# Patient Record
Sex: Female | Born: 1995 | Race: Black or African American | Hispanic: No | Marital: Single | Smoking: Current every day smoker
Health system: Southern US, Community
[De-identification: ages and names within clinical notes are randomized; demographics above are authoritative.]

## PROBLEM LIST (undated history)

## (undated) DIAGNOSIS — R51 Headache: Secondary | ICD-10-CM

## (undated) DIAGNOSIS — R519 Headache, unspecified: Secondary | ICD-10-CM

## (undated) DIAGNOSIS — H539 Unspecified visual disturbance: Secondary | ICD-10-CM

## (undated) DIAGNOSIS — M419 Scoliosis, unspecified: Secondary | ICD-10-CM

## (undated) HISTORY — DX: Headache, unspecified: R51.9

## (undated) HISTORY — PX: CYST REMOVAL NECK: SHX6281

## (undated) HISTORY — DX: Headache: R51

## (undated) HISTORY — DX: Unspecified visual disturbance: H53.9

---
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
## 2016-05-20 ENCOUNTER — Encounter (HOSPITAL_COMMUNITY): Payer: Self-pay

## 2016-05-20 ENCOUNTER — Emergency Department (HOSPITAL_COMMUNITY)
Admission: EM | Admit: 2016-05-20 | Discharge: 2016-05-20 | Disposition: A | Discharge status: Home / Self Care | Payer: Medicaid Other | Attending: Emergency Medicine | Admitting: Emergency Medicine

## 2016-05-20 DIAGNOSIS — Y999 Unspecified external cause status: Secondary | ICD-10-CM | POA: Insufficient documentation

## 2016-05-20 DIAGNOSIS — Y929 Unspecified place or not applicable: Secondary | ICD-10-CM | POA: Insufficient documentation

## 2016-05-20 DIAGNOSIS — Y939 Activity, unspecified: Secondary | ICD-10-CM | POA: Insufficient documentation

## 2016-05-20 DIAGNOSIS — Z79899 Other long term (current) drug therapy: Secondary | ICD-10-CM | POA: Insufficient documentation

## 2016-05-20 DIAGNOSIS — S0500XA Injury of conjunctiva and corneal abrasion without foreign body, unspecified eye, initial encounter: Secondary | ICD-10-CM | POA: Insufficient documentation

## 2016-05-20 DIAGNOSIS — X58XXXA Exposure to other specified factors, initial encounter: Secondary | ICD-10-CM | POA: Insufficient documentation

## 2016-05-20 HISTORY — DX: Scoliosis, unspecified: M41.9

## 2016-05-20 MED ORDER — FLUORESCEIN SODIUM 1 MG OP STRP
1.0000 | ORAL_STRIP | Freq: Once | OPHTHALMIC | Status: AC
  Administered 2016-05-20: 1 via OPHTHALMIC
  Filled 2016-05-20: qty 1

## 2016-05-20 MED ORDER — KETOROLAC TROMETHAMINE 0.5 % OP SOLN: 1.0000 [drp] | Freq: Three times a day (TID) | OPHTHALMIC | 0 refills | Status: AC | PRN

## 2016-05-20 MED ORDER — ERYTHROMYCIN 5 MG/GM OP OINT: TOPICAL_OINTMENT | OPHTHALMIC | 0 refills | Status: AC

## 2016-05-20 MED ORDER — TETRACAINE HCL 0.5 % OP SOLN
2.0000 [drp] | Freq: Once | OPHTHALMIC | Status: AC
  Administered 2016-05-20: 2 [drp] via OPHTHALMIC
  Filled 2016-05-20: qty 2

## 2016-05-20 MED ORDER — ERYTHROMYCIN 5 MG/GM OP OINT
1.0000 "application " | TOPICAL_OINTMENT | Freq: Once | OPHTHALMIC | Status: AC
  Administered 2016-05-20: 1 via OPHTHALMIC
  Filled 2016-05-20: qty 3.5

## 2016-05-20 NOTE — ED Triage Notes (Signed)
Pt presents with 1 week h/o generalized body pain.  Pt denies any injury, unsure of origin of pain.  Pt reports awakening with bilateral eye pain, reports eyelids were dried together from drainage.  Pt does not wear contacts.  Pt reports no bowel movement x 4 days ago, reports "a little" abdominal pain, reports chest pain, reports sneezing.

## 2016-05-20 NOTE — ED Notes (Signed)
MD at bedside. 

## 2016-05-20 NOTE — ED Notes (Signed)
Pt c/o generalized body aches and aching eyes since yesterday.

## 2016-05-20 NOTE — Discharge Instructions (Signed)
Not sure why uoi have corneal abrasions - but that's the reason for your pain. Apply antibiotics and use the topical pain meds. Call the eye specialist today and set up an appt for coming Monday or Tuesday. Return to the ER if the symptoms get worse.

## 2016-05-20 NOTE — ED Provider Notes (Signed)
[facility]-EMERGENCY DEPT Provider Note   CSN: [id number] Arrival date & time: 05/20/16  1029     History   Chief Complaint No chief complaint on file.   HPI Katrina Mcguire is a 20 y.o. female.  HPI  Pt comes in with cc of bilateral eye pain. Pt states that she went to bed fine, woke up with pain in her eye, which is worse when light gets in her. Pain is bilateral. She denies any known chemical exposure. Does report that she was rubbing her eyes prior to going to bed as she was sleepy. She denies watching TV late in dark room last night. She denies any medical problems or hx of eye problems. Her vision is blurry, but she is unable to fully open her eye.   Past Medical History:  Diagnosis Date  . Scoliosis     There are no active problems to display for this patient.   Past Surgical History:  Procedure Laterality Date  . CYST REMOVAL NECK      OB History    No data available       Home Medications    Prior to Admission medications   Medication Sig Start Date End Date Taking? Authorizing Provider  diphenhydrAMINE (BENADRYL) 25 MG tablet Take 25 mg by mouth every 6 (six) hours as needed for itching.   Yes Historical Provider, MD  erythromycin ophthalmic ointment Place a 1/2 inch ribbon of ointment into the lower eyelid. Apply 6 times a day. 05/20/16   Derwood Kaplan, MD  ketorolac (ACULAR) 0.5 % ophthalmic solution Place 1 drop into both eyes 3 (three) times daily as needed. 05/20/16   Derwood Kaplan, MD  medroxyPROGESTERone (DEPO-PROVERA) 150 MG/ML injection INJECT 150 MG IM EVERY 3 MONTHS 06/10/15   Historical Provider, MD  meloxicam (MOBIC) 7.5 MG tablet Take 1 tablet (7.5 mg total) by mouth 2 (two) times daily after a meal. Patient not taking: Reported on 05/20/2016 09/17/15   Linna Hoff, MD  naproxen (NAPROSYN) 500 MG tablet Take 1 tablet (500 mg total) by mouth 2 (two) times daily with a meal. Patient not taking: Reported on 05/20/2016 06/26/15   Janne Napoleon, NP    traMADol (ULTRAM) 50 MG tablet Take 1 tablet (50 mg total) by mouth every 6 (six) hours as needed. Patient not taking: Reported on 05/20/2016 07/17/15   Arthor Captain, PA-C    Family History History reviewed. No pertinent family history.  Social History Social History  Substance Use Topics  . Smoking status: Never Smoker  . Smokeless tobacco: Never Used  . Alcohol use No     Allergies   Review of patient's allergies indicates no known allergies.   Review of Systems Review of Systems  ROS 10 Systems reviewed and are negative for acute change except as noted in the HPI.     Physical Exam Updated Vital Signs BP 121/89 (BP Location: Right Arm)   Pulse 93   Temp 98.4 F (36.9 C) (Oral)   Resp 18   Ht 5\' 4"  (1.626 m)   Wt 190 lb (86.2 kg)   SpO2 99%   BMI 32.61 kg/m   Physical Exam  Constitutional: She is oriented to person, place, and time. She appears well-developed.  HENT:  Head: Normocephalic and atraumatic.  Eyes: EOM are normal.  Bilateral Eye exam: EOMI, PERRL No photophobia  Eye lid eversion reveals no foreign body Pt has no chemosis Fluorecin test under woods lamp reveals that on the L eye,  around 5 o clock she has dye uptake and on the R eye she has dye uptake at 6 o clock of the cornea. tonopen reveals pressures of 16, and 19 on the L eye, R eye wasn't done due to tonopen stopped working and suspicion after normal pressure on the L eye for elevated R eye pressure was low.     Visual Acuity  Right Eye Distance:   Left Eye Distance:   Bilateral Distance:    Right Eye Near: R Near: 20/30 Left Eye Near:  L Near: 20/40 Bilateral Near:  20/30   Neck: Normal range of motion. Neck supple.  Cardiovascular: Normal rate.   Pulmonary/Chest: Effort normal.  Abdominal: Bowel sounds are normal.  Neurological: She is alert and oriented to person, place, and time.  Skin: Skin is warm and dry.  Nursing note and vitals reviewed.    ED Treatments /  Results  Labs (all labs ordered are listed, but only abnormal results are displayed) Labs Reviewed - No data to display  EKG  EKG Interpretation None       Radiology No results found.  Procedures Procedures (including critical care time)  Medications Ordered in ED Medications  erythromycin ophthalmic ointment 1 application (not administered)  fluorescein ophthalmic strip 1 strip (1 strip Both Eyes Given 05/20/16 1149)  tetracaine (PONTOCAINE) 0.5 % ophthalmic solution 2 drop (2 drops Both Eyes Given 05/20/16 1149)     Initial Impression / Assessment and Plan / ED Course  I have reviewed the triage vital signs and the nursing notes.  Pertinent labs & imaging results that were available during my care of the patient were reviewed by me and considered in my medical decision making (see chart for details).  Clinical Course    Pt with eye pain, bilateral, with vision complains. Eye exam reveals corneal abrasion. Slight vision disturbance. She has photophobia. No contacts used. Will start erythromycin and give toradol drops. Pt asked to see eye specialist.  Final Clinical Impressions(s) / ED Diagnoses   Final diagnoses:  Corneal abrasion, unspecified laterality, initial encounter    New Prescriptions New Prescriptions   ERYTHROMYCIN OPHTHALMIC OINTMENT    Place a 1/2 inch ribbon of ointment into the lower eyelid. Apply 6 times a day.   KETOROLAC (ACULAR) 0.5 % OPHTHALMIC SOLUTION    Place 1 drop into both eyes 3 (three) times daily as needed.     Derwood KaplanAnkit Khalif Stender, MD 05/20/16 [phone]

## 2016-05-20 NOTE — ED Notes (Signed)
Pt states she understands instructions. Home stable with steady gait. 

## 2016-12-22 ENCOUNTER — Emergency Department (HOSPITAL_COMMUNITY): Payer: Medicaid Other

## 2016-12-22 ENCOUNTER — Emergency Department (HOSPITAL_COMMUNITY)
Admission: EM | Admit: 2016-12-22 | Discharge: 2016-12-22 | Discharge status: Against Medical Advice | Payer: Medicaid Other | Attending: Emergency Medicine | Admitting: Emergency Medicine

## 2016-12-22 ENCOUNTER — Encounter (HOSPITAL_COMMUNITY): Payer: Self-pay

## 2016-12-22 DIAGNOSIS — Z5321 Procedure and treatment not carried out due to patient leaving prior to being seen by health care provider: Secondary | ICD-10-CM | POA: Insufficient documentation

## 2016-12-22 DIAGNOSIS — R079 Chest pain, unspecified: Secondary | ICD-10-CM | POA: Insufficient documentation

## 2016-12-22 LAB — CBC
HEMATOCRIT: 41.7 % (ref 36.0–46.0)
Hemoglobin: 13.3 g/dL (ref 12.0–15.0)
MCH: 25.3 pg — ABNORMAL LOW (ref 26.0–34.0)
MCHC: 31.9 g/dL (ref 30.0–36.0)
MCV: 79.4 fL (ref 78.0–100.0)
Platelets: 229 10*3/uL (ref 150–400)
RBC: 5.25 MIL/uL — ABNORMAL HIGH (ref 3.87–5.11)
RDW: 14.9 % (ref 11.5–15.5)
WBC: 10.4 10*3/uL (ref 4.0–10.5)

## 2016-12-22 LAB — BASIC METABOLIC PANEL
Anion gap: 11 (ref 5–15)
BUN: 9 mg/dL (ref 6–20)
CO2: 23 mmol/L (ref 22–32)
Calcium: 9.1 mg/dL (ref 8.9–10.3)
Chloride: 102 mmol/L (ref 101–111)
Creatinine, Ser: 0.62 mg/dL (ref 0.44–1.00)
GFR calc non Af Amer: >60 mL/min (ref >60)
Glucose, Bld: 89 mg/dL (ref 65–99)
Potassium: 3.6 mmol/L (ref 3.5–5.1)
SODIUM: 136 mmol/L (ref 135–145)

## 2016-12-22 LAB — I-STAT TROPONIN, ED: Troponin i, poc: 0 ng/mL (ref 0.00–0.08)

## 2016-12-22 NOTE — ED Triage Notes (Signed)
Pt states that around 11pm, central CP started, denies SOB, n/v, states she felt dizzy when CP started.

## 2016-12-22 NOTE — ED Notes (Signed)
Pt called from waiting room with no answer 

## 2017-05-01 ENCOUNTER — Ambulatory Visit (INDEPENDENT_AMBULATORY_CARE_PROVIDER_SITE_OTHER): Payer: Medicaid Other | Admitting: Neurology

## 2017-05-01 ENCOUNTER — Encounter: Payer: Self-pay | Admitting: Neurology

## 2017-05-01 DIAGNOSIS — G43709 Chronic migraine without aura, not intractable, without status migrainosus: Secondary | ICD-10-CM | POA: Diagnosis not present

## 2017-05-01 DIAGNOSIS — IMO0002 Reserved for concepts with insufficient information to code with codable children: Secondary | ICD-10-CM | POA: Insufficient documentation

## 2017-05-01 MED ORDER — RIZATRIPTAN BENZOATE 10 MG PO TBDP: 10.0000 mg | ORAL_TABLET | ORAL | 6 refills | Status: DC | PRN

## 2017-05-01 MED ORDER — ONDANSETRON 4 MG PO TBDP: 4.0000 mg | ORAL_TABLET | Freq: Three times a day (TID) | ORAL | 6 refills | Status: AC | PRN

## 2017-05-01 MED ORDER — VENLAFAXINE HCL ER 37.5 MG PO CP24: 37.5000 mg | ORAL_CAPSULE | Freq: Every day | ORAL | 11 refills | Status: AC

## 2017-05-01 MED ORDER — VENLAFAXINE HCL ER 37.5 MG PO CP24: 37.5000 mg | ORAL_CAPSULE | Freq: Every day | ORAL | 11 refills | Status: DC

## 2017-05-01 MED ORDER — RIZATRIPTAN BENZOATE 10 MG PO TBDP: 10.0000 mg | ORAL_TABLET | ORAL | 6 refills | Status: AC | PRN

## 2017-05-01 MED ORDER — ONDANSETRON 4 MG PO TBDP: 4.0000 mg | ORAL_TABLET | Freq: Three times a day (TID) | ORAL | 6 refills | Status: DC | PRN

## 2017-05-01 NOTE — Progress Notes (Signed)
PATIENT: Katrina Mcguire DOB: 01-04-1996  Chief Complaint  Patient presents with  . Headache    Room 4.  Headaches onset 1 yr. ago. Sts. 2-3 h/a days per week, most often while she is at work (works as an International aid/development worker at [employer]). Not much relief with Ibuprofen or Tylenol. Has worn glasses since she was 6; lenses were updated in July 2017. Sts. she saw her eye doctor last week and rx. has not changed.  Visual acuity today 20/40 bilat with glasses on/fim     HISTORICAL  Katrina Mcguire is a 21 years old female,  seen in refer by optometrist Dr. Conley Rolls, [place],for evaluation of frequent headaches, initial evaluation was May 01 2017.  She reported a history of occasional headache around her menstruation in the past, complains of increased headache since 2017 since her birth control placement Nexplanon, she complains of frequent left frontotemporal region pounding headache with associated light noise sensitivity, lasting for 2-3 hours, with associated nausea, she has 2-3 headaches each week, she has tried over-the-counter ibuprofen Tylenol without helping.  Also complains of depression anxiety, taking trazodone 150 mg every night for chronic insomnia  Trigger for her headaches are bright light, strong smells, loud noise,  REVIEW OF SYSTEMS: Full 14 system review of systems performed and notable only for blurred vision, joint pain, constipation, urination problems, weakness, insomnia, dizziness  ALLERGIES: No Known Allergies  HOME MEDICATIONS: Current Outpatient Prescriptions  Medication Sig Dispense Refill  . erythromycin ophthalmic ointment Place a 1/2 inch ribbon of ointment into the lower eyelid. Apply 6 times a day. 3.5 g 0  . etonogestrel (NEXPLANON) 68 MG IMPL implant 1 each by Subdermal route once.    . traZODone (DESYREL) 150 MG tablet Take 150 mg by mouth at bedtime.    . diphenhydrAMINE (BENADRYL) 25 MG tablet Take 25 mg by mouth every 6 (six) hours as needed  for itching.    Marland Kitchen ketorolac (ACULAR) 0.5 % ophthalmic solution Place 1 drop into both eyes 3 (three) times daily as needed. (Patient not taking: Reported on 05/01/2017) 5 mL 0  . medroxyPROGESTERone (DEPO-PROVERA) 150 MG/ML injection INJECT 150 MG IM EVERY 3 MONTHS  0  . meloxicam (MOBIC) 7.5 MG tablet Take 1 tablet (7.5 mg total) by mouth 2 (two) times daily after a meal. (Patient not taking: Reported on 05/20/2016) 30 tablet 1  . naproxen (NAPROSYN) 500 MG tablet Take 1 tablet (500 mg total) by mouth 2 (two) times daily with a meal. (Patient not taking: Reported on 05/20/2016) 20 tablet 5  . traMADol (ULTRAM) 50 MG tablet Take 1 tablet (50 mg total) by mouth every 6 (six) hours as needed. (Patient not taking: Reported on 05/20/2016) 6 tablet 0   No current facility-administered medications for this visit.     PAST MEDICAL HISTORY: Past Medical History:  Diagnosis Date  . Headache   . Scoliosis   . Vision abnormalities     PAST SURGICAL HISTORY: Past Surgical History:  Procedure Laterality Date  . CYST REMOVAL NECK      FAMILY HISTORY: Family History  Problem Relation Age of Onset  . Hypertension Mother   . Asthma Sister     SOCIAL HISTORY:  Social History   Social History  . Marital status: Single    Spouse name: N/A  . Number of children: N/A  . Years of education: N/A   Occupational History  . Not on file.   Social History Main Topics  .  Smoking status: Current Every Day Smoker    Types: E-cigarettes, Cigarettes  . Smokeless tobacco: Never Used  . Alcohol use No  . Drug use: No  . Sexual activity: Not on file   Other Topics Concern  . Not on file   Social History Narrative  . No narrative on file     PHYSICAL EXAM   Vitals:   05/01/17 1039  BP: 126/74  Pulse: 90  Resp: 14  Weight: 175 lb 8 oz (79.6 kg)  Height: 5\' 4"  (1.626 m)    Not recorded      Body mass index is 30.12 kg/m.  PHYSICAL EXAMNIATION:  Gen: NAD, conversant, well nourised,  obese, well groomed                     Cardiovascular: Regular rate rhythm, no peripheral edema, warm, nontender. Eyes: Conjunctivae clear without exudates or hemorrhage Neck: Supple, no carotid bruits. Pulmonary: Clear to auscultation bilaterally   NEUROLOGICAL EXAM:  MENTAL STATUS: Speech:    Speech is normal; fluent and spontaneous with normal comprehension.  Cognition:     Orientation to time, place and person     Normal recent and remote memory     Normal Attention span and concentration     Normal Language, naming, repeating,spontaneous speech     Fund of knowledge   CRANIAL NERVES: CN II: Visual fields are full to confrontation. Fundoscopic exam is normal with sharp discs and no vascular changes. Pupils are round equal and briskly reactive to light. CN III, IV, VI: extraocular movement are normal. No ptosis. CN V: Facial sensation is intact to pinprick in all 3 divisions bilaterally. Corneal responses are intact.  CN VII: Face is symmetric with normal eye closure and smile. CN VIII: Hearing is normal to rubbing fingers CN IX, X: Palate elevates symmetrically. Phonation is normal. CN XI: Head turning and shoulder shrug are intact CN XII: Tongue is midline with normal movements and no atrophy.  MOTOR: There is no pronator drift of out-stretched arms. Muscle bulk and tone are normal. Muscle strength is normal.  REFLEXES: Reflexes are 2+ and symmetric at the biceps, triceps, knees, and ankles. Plantar responses are flexor.  SENSORY: Intact to light touch, pinprick, positional sensation and vibratory sensation are intact in fingers and toes.  COORDINATION: Rapid alternating movements and fine finger movements are intact. There is no dysmetria on finger-to-nose and heel-knee-shin.    GAIT/STANCE: Posture is normal. Gait is steady with normal steps, base, arm swing, and turning. Heel and toe walking are normal. Tandem gait is normal.  Romberg is absent.   DIAGNOSTIC DATA  (LABS, IMAGING, TESTING) - I reviewed patient records, labs, notes, testing and imaging myself where available.   ASSESSMENT AND PLAN  Katrina Mcguire is a 21 y.o. female   Chronic migraine headaches  Start preventive medications,magnesium oxide 400 mg twice a day, riboflavin 100 mg twice a day,  Effexor ER 37.5 mg daily  Maxalt, and Zofran as needed Chronic depression anxiety  She is on trazodone 150 mg every night   Levert FeinsteinYijun Jema Deegan, M.D. Ph.D.  [facility] [address], [address] [place], [place] [address] Ph: [phone] Fax: [phone]  LK:[id number]:Kramer, StonewallMinda, NP, Conley RollsLe, My ConleyHong, [place]

## 2017-05-01 NOTE — Patient Instructions (Signed)
Magnesium oxide 400 mg twice a day Riboflavin  100 mg twice a day 

## 2017-07-06 ENCOUNTER — Telehealth: Payer: Self-pay | Admitting: *Deleted

## 2017-07-06 ENCOUNTER — Ambulatory Visit: Payer: Medicaid Other | Admitting: Neurology

## 2017-07-06 NOTE — Telephone Encounter (Signed)
No showed follow up appointment. 

## 2017-07-07 ENCOUNTER — Encounter: Payer: Self-pay | Admitting: Neurology

## 2018-12-12 IMAGING — CR DG CHEST 2V
2 series · 2 of 2 positions shown · non-contrast
Comparison: 09/05/2014

CLINICAL DATA: Midsternal chest pain tonight

EXAM:
CHEST  2 VIEW

[chest pa]
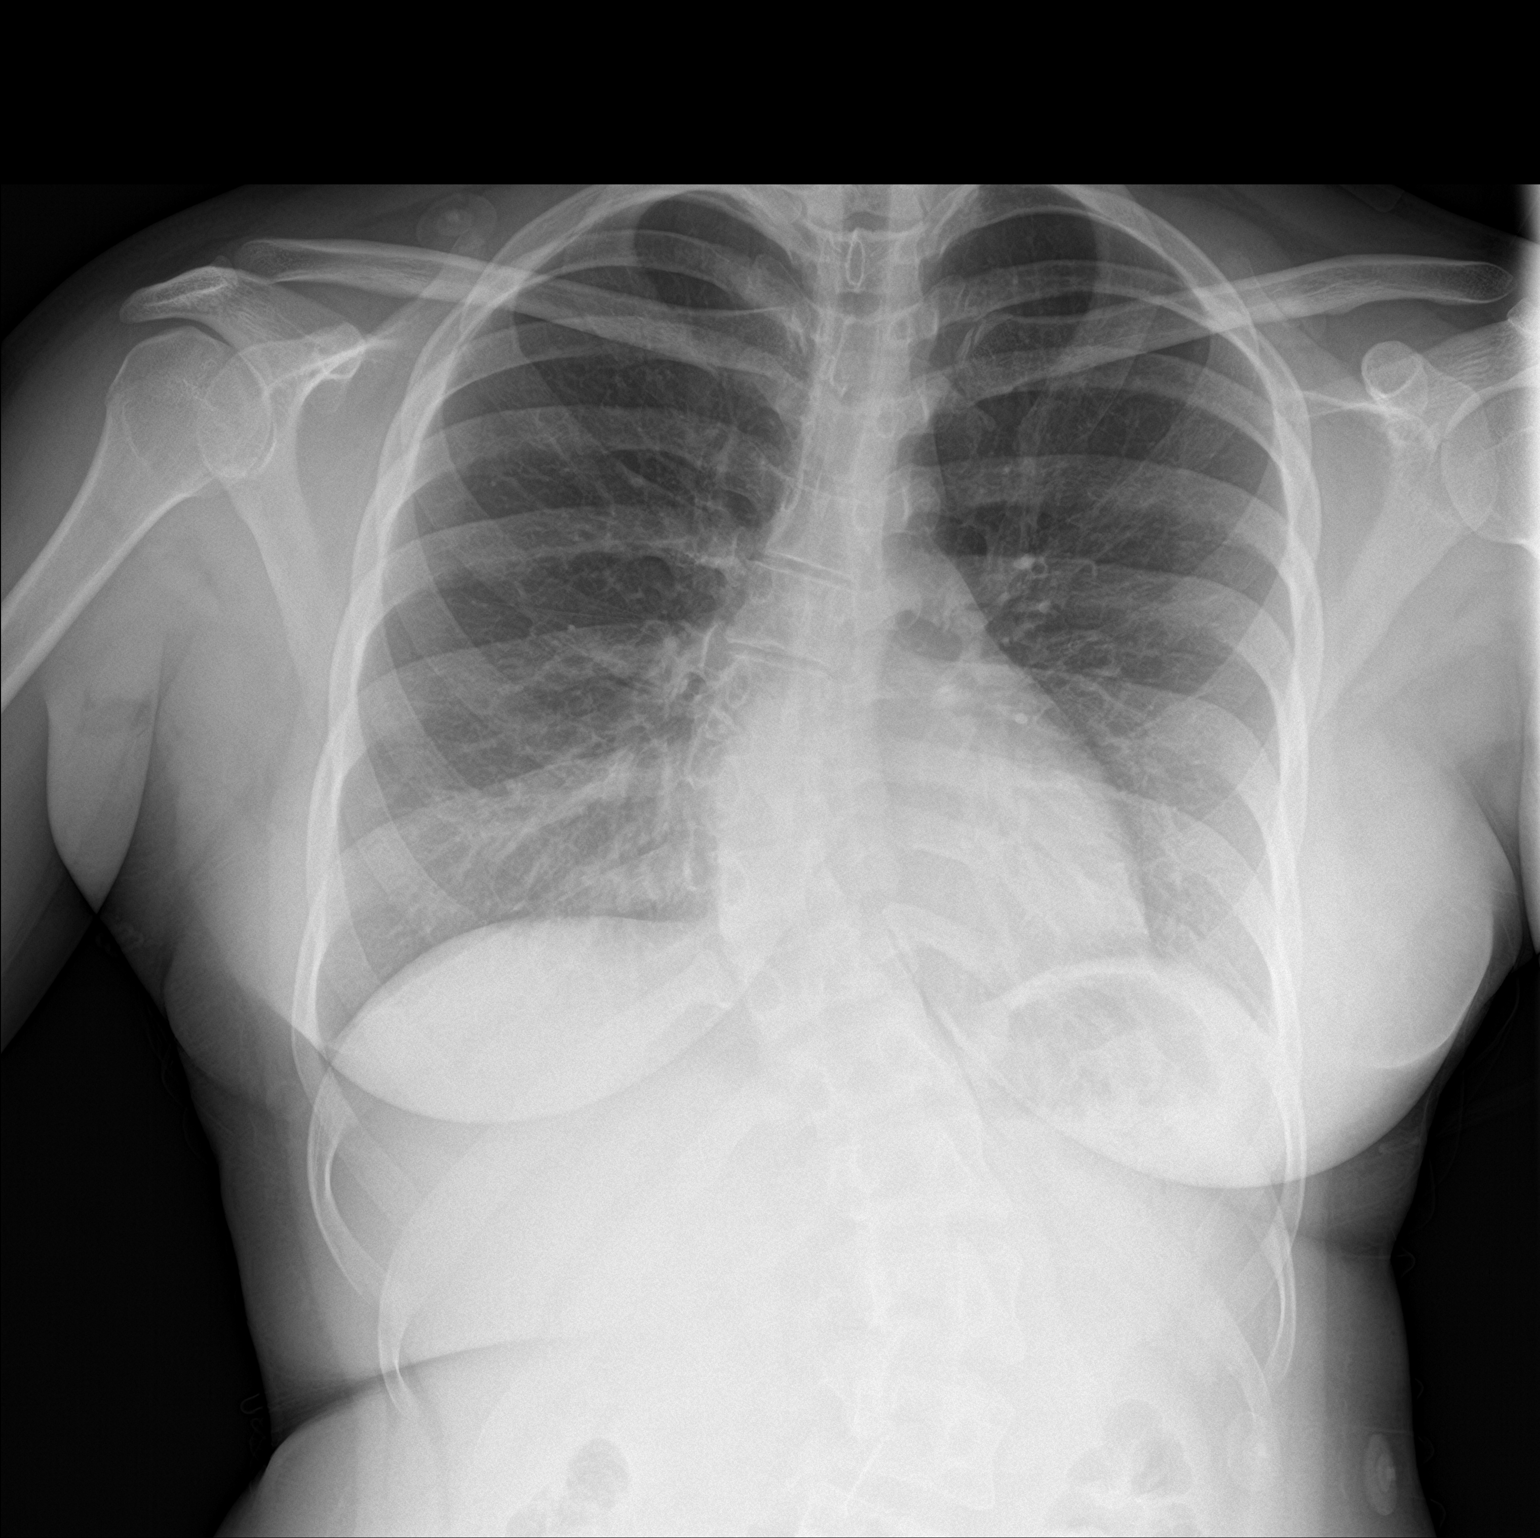

[chest lat]
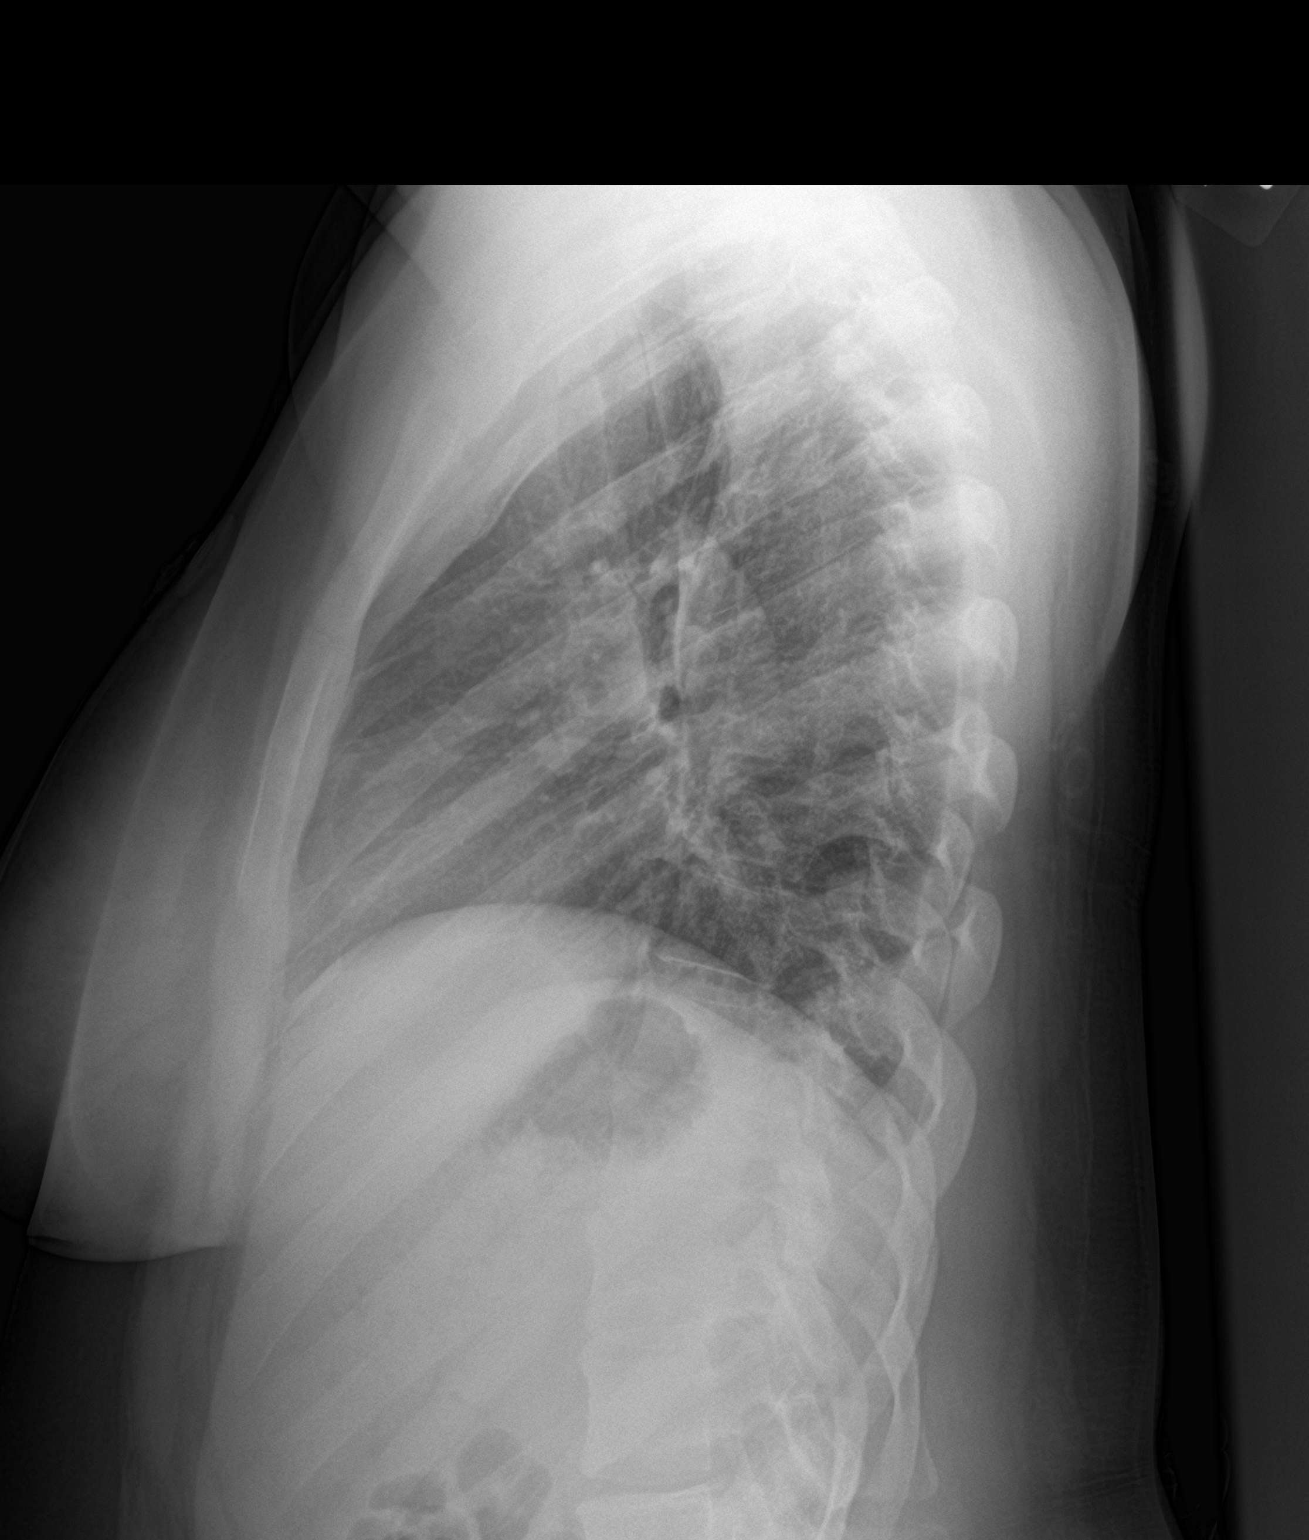

[2 of 2 positions shown; findings below may reference images not displayed]

FINDINGS: The lungs are clear. The pulmonary vasculature is normal. Heart size
is normal. Hilar and mediastinal contours are unremarkable. There is
no pleural effusion. Marked thoracolumbar scoliosis again evident.
IMPRESSION: No active cardiopulmonary disease.  Thoracolumbar scoliosis.
